# Patient Record
Sex: Male | Born: 1940 | Race: White | Hispanic: No | Marital: Married | Smoking: Never smoker
Health system: Southern US, Community
[De-identification: ages and names within clinical notes are randomized; demographics above are authoritative.]

## PROBLEM LIST (undated history)

## (undated) DIAGNOSIS — R55 Syncope and collapse: Secondary | ICD-10-CM

## (undated) DIAGNOSIS — R001 Bradycardia, unspecified: Secondary | ICD-10-CM

## (undated) DIAGNOSIS — I4891 Unspecified atrial fibrillation: Secondary | ICD-10-CM

## (undated) DIAGNOSIS — T68XXXA Hypothermia, initial encounter: Secondary | ICD-10-CM

## (undated) DIAGNOSIS — G309 Alzheimer's disease, unspecified: Secondary | ICD-10-CM

## (undated) DIAGNOSIS — F0280 Dementia in other diseases classified elsewhere without behavioral disturbance: Secondary | ICD-10-CM

## (undated) HISTORY — PX: HERNIA REPAIR: SHX51

---

## 2007-04-27 ENCOUNTER — Emergency Department (HOSPITAL_COMMUNITY): Admission: EM | Admit: 2007-04-27 | Discharge: 2007-04-27 | Payer: Self-pay | Admitting: Emergency Medicine

## 2011-06-06 LAB — HEMOGLOBIN AND HEMATOCRIT, BLOOD: Hemoglobin: 14.1

## 2016-09-17 ENCOUNTER — Inpatient Hospital Stay (HOSPITAL_COMMUNITY)
Admission: EM | Admit: 2016-09-17 | Discharge: 2016-09-18 | DRG: 260 | Disposition: A | Discharge status: Home / Self Care | Payer: Medicare Other | Attending: Internal Medicine | Admitting: Internal Medicine

## 2016-09-17 ENCOUNTER — Emergency Department (HOSPITAL_COMMUNITY): Payer: Medicare Other

## 2016-09-17 ENCOUNTER — Encounter (HOSPITAL_COMMUNITY): Payer: Self-pay | Admitting: Emergency Medicine

## 2016-09-17 DIAGNOSIS — R55 Syncope and collapse: Secondary | ICD-10-CM | POA: Diagnosis present

## 2016-09-17 DIAGNOSIS — Z79899 Other long term (current) drug therapy: Secondary | ICD-10-CM | POA: Diagnosis not present

## 2016-09-17 DIAGNOSIS — Z7982 Long term (current) use of aspirin: Secondary | ICD-10-CM | POA: Diagnosis not present

## 2016-09-17 DIAGNOSIS — R680 Hypothermia, not associated with low environmental temperature: Secondary | ICD-10-CM | POA: Diagnosis present

## 2016-09-17 DIAGNOSIS — G9340 Encephalopathy, unspecified: Secondary | ICD-10-CM | POA: Diagnosis not present

## 2016-09-17 DIAGNOSIS — F0280 Dementia in other diseases classified elsewhere without behavioral disturbance: Secondary | ICD-10-CM | POA: Diagnosis present

## 2016-09-17 DIAGNOSIS — I480 Paroxysmal atrial fibrillation: Secondary | ICD-10-CM | POA: Insufficient documentation

## 2016-09-17 DIAGNOSIS — E785 Hyperlipidemia, unspecified: Secondary | ICD-10-CM | POA: Diagnosis not present

## 2016-09-17 DIAGNOSIS — Z7901 Long term (current) use of anticoagulants: Secondary | ICD-10-CM | POA: Diagnosis not present

## 2016-09-17 DIAGNOSIS — Z8673 Personal history of transient ischemic attack (TIA), and cerebral infarction without residual deficits: Secondary | ICD-10-CM

## 2016-09-17 DIAGNOSIS — G309 Alzheimer's disease, unspecified: Secondary | ICD-10-CM | POA: Diagnosis present

## 2016-09-17 DIAGNOSIS — Z87891 Personal history of nicotine dependence: Secondary | ICD-10-CM | POA: Diagnosis not present

## 2016-09-17 DIAGNOSIS — R001 Bradycardia, unspecified: Secondary | ICD-10-CM | POA: Diagnosis present

## 2016-09-17 DIAGNOSIS — I4891 Unspecified atrial fibrillation: Secondary | ICD-10-CM | POA: Insufficient documentation

## 2016-09-17 DIAGNOSIS — F0150 Vascular dementia without behavioral disturbance: Secondary | ICD-10-CM | POA: Diagnosis present

## 2016-09-17 DIAGNOSIS — T68XXXA Hypothermia, initial encounter: Secondary | ICD-10-CM | POA: Diagnosis not present

## 2016-09-17 HISTORY — DX: Bradycardia, unspecified: R00.1

## 2016-09-17 HISTORY — DX: Unspecified atrial fibrillation: I48.91

## 2016-09-17 HISTORY — DX: Hypothermia, initial encounter: T68.XXXA

## 2016-09-17 HISTORY — DX: Alzheimer's disease, unspecified: G30.9

## 2016-09-17 HISTORY — DX: Syncope and collapse: R55

## 2016-09-17 HISTORY — DX: Dementia in other diseases classified elsewhere, unspecified severity, without behavioral disturbance, psychotic disturbance, mood disturbance, and anxiety: F02.80

## 2016-09-17 LAB — I-STAT CG4 LACTIC ACID, ED
LACTIC ACID, VENOUS: 1.87 mmol/L (ref 0.5–1.9)
LACTIC ACID, VENOUS: 1.79 mmol/L (ref 0.5–1.9)

## 2016-09-17 LAB — CBC WITH DIFFERENTIAL/PLATELET
Basophils Absolute: 0 10*3/uL (ref 0.0–0.1)
Basophils Relative: 1 %
Eosinophils Absolute: 0.2 10*3/uL (ref 0.0–0.7)
Eosinophils Relative: 4 %
HCT: 39 % (ref 39.0–52.0)
Hemoglobin: 12.4 g/dL — ABNORMAL LOW (ref 13.0–17.0)
Lymphocytes Relative: 23 %
Lymphs Abs: 1 10*3/uL (ref 0.7–4.0)
MCH: 28.6 pg (ref 26.0–34.0)
MCHC: 31.8 g/dL (ref 30.0–36.0)
MCV: 90.1 fL (ref 78.0–100.0)
Monocytes Absolute: 0.3 10*3/uL (ref 0.1–1.0)
Monocytes Relative: 8 %
Neutro Abs: 2.8 10*3/uL (ref 1.7–7.7)
Neutrophils Relative %: 64 %
Platelets: 161 10*3/uL (ref 150–400)
RBC: 4.33 MIL/uL (ref 4.22–5.81)
RDW: 14.3 % (ref 11.5–15.5)
WBC: 4.4 10*3/uL (ref 4.0–10.5)

## 2016-09-17 LAB — BASIC METABOLIC PANEL
Anion gap: 7 (ref 5–15)
BUN: 12 mg/dL (ref 6–20)
CO2: 22 mmol/L (ref 22–32)
Calcium: 8.7 mg/dL — ABNORMAL LOW (ref 8.9–10.3)
Chloride: 111 mmol/L (ref 101–111)
Creatinine, Ser: 0.79 mg/dL (ref 0.61–1.24)
GFR calc Af Amer: >60 mL/min (ref >60)
GFR calc non Af Amer: >60 mL/min (ref >60)
Glucose, Bld: 110 mg/dL — ABNORMAL HIGH (ref 65–99)
Potassium: 3.7 mmol/L (ref 3.5–5.1)
Sodium: 140 mmol/L (ref 135–145)

## 2016-09-17 LAB — TROPONIN I

## 2016-09-17 LAB — I-STAT TROPONIN, ED: TROPONIN I, POC: 0.01 ng/mL (ref 0.00–0.08)

## 2016-09-17 LAB — TSH: TSH: 2.283 u[IU]/mL (ref 0.350–4.500)

## 2016-09-17 LAB — VITAMIN B12: VITAMIN B 12: 200 pg/mL (ref 180–914)

## 2016-09-17 LAB — MRSA PCR SCREENING: MRSA by PCR: NEGATIVE

## 2016-09-17 LAB — MAGNESIUM: Magnesium: 2.1 mg/dL (ref 1.7–2.4)

## 2016-09-17 MED ORDER — PIPERACILLIN-TAZOBACTAM 3.375 G IVPB
3.3750 g | Freq: Once | INTRAVENOUS | Status: AC
  Administered 2016-09-17: 3.375 g via INTRAVENOUS
  Filled 2016-09-17: qty 50

## 2016-09-17 MED ORDER — SODIUM CHLORIDE 0.9% FLUSH
3.0000 mL | Freq: Two times a day (BID) | INTRAVENOUS | Status: DC
  Administered 2016-09-17: 3 mL via INTRAVENOUS

## 2016-09-17 MED ORDER — ATROPINE SULFATE 1 MG/10ML IJ SOSY
1.0000 mg | PREFILLED_SYRINGE | Freq: Once | INTRAMUSCULAR | Status: AC
  Administered 2016-09-17: 1 mg via INTRAVENOUS
  Filled 2016-09-17: qty 10

## 2016-09-17 MED ORDER — PRAVASTATIN SODIUM 40 MG PO TABS
80.0000 mg | ORAL_TABLET | Freq: Every day | ORAL | Status: DC
  Administered 2016-09-17 – 2016-09-18 (×2): 80 mg via ORAL
  Filled 2016-09-17 (×3): qty 2

## 2016-09-17 MED ORDER — DOCUSATE SODIUM 100 MG PO CAPS
100.0000 mg | ORAL_CAPSULE | Freq: Two times a day (BID) | ORAL | Status: DC
  Administered 2016-09-17 – 2016-09-18 (×3): 100 mg via ORAL
  Filled 2016-09-17 (×3): qty 1

## 2016-09-17 MED ORDER — ONDANSETRON HCL 4 MG/2ML IJ SOLN: 4.0000 mg | Freq: Four times a day (QID) | INTRAMUSCULAR | Status: DC | PRN

## 2016-09-17 MED ORDER — ALUM & MAG HYDROXIDE-SIMETH 200-200-20 MG/5ML PO SUSP: 30.0000 mL | Freq: Four times a day (QID) | ORAL | Status: DC | PRN

## 2016-09-17 MED ORDER — ASPIRIN EC 81 MG PO TBEC
81.0000 mg | DELAYED_RELEASE_TABLET | Freq: Every day | ORAL | Status: DC
  Administered 2016-09-17 – 2016-09-18 (×2): 81 mg via ORAL
  Filled 2016-09-17 (×2): qty 1

## 2016-09-17 MED ORDER — ACETAMINOPHEN 650 MG RE SUPP: 650.0000 mg | Freq: Four times a day (QID) | RECTAL | Status: DC | PRN

## 2016-09-17 MED ORDER — POTASSIUM CHLORIDE CRYS ER 20 MEQ PO TBCR
20.0000 meq | EXTENDED_RELEASE_TABLET | Freq: Once | ORAL | Status: AC
  Administered 2016-09-17: 20 meq via ORAL
  Filled 2016-09-17: qty 1

## 2016-09-17 MED ORDER — ENOXAPARIN SODIUM 40 MG/0.4ML ~~LOC~~ SOLN
40.0000 mg | Freq: Every day | SUBCUTANEOUS | Status: DC
  Administered 2016-09-17 – 2016-09-18 (×2): 40 mg via SUBCUTANEOUS
  Filled 2016-09-17 (×2): qty 0.4

## 2016-09-17 MED ORDER — DONEPEZIL HCL 10 MG PO TABS
20.0000 mg | ORAL_TABLET | Freq: Every day | ORAL | Status: DC
  Administered 2016-09-17: 20 mg via ORAL
  Filled 2016-09-17 (×3): qty 2

## 2016-09-17 MED ORDER — ACETAMINOPHEN 325 MG PO TABS: 650.0000 mg | ORAL_TABLET | Freq: Four times a day (QID) | ORAL | Status: DC | PRN

## 2016-09-17 MED ORDER — ONDANSETRON HCL 4 MG PO TABS: 4.0000 mg | ORAL_TABLET | Freq: Four times a day (QID) | ORAL | Status: DC | PRN

## 2016-09-17 MED ORDER — VANCOMYCIN HCL IN DEXTROSE 1-5 GM/200ML-% IV SOLN
1000.0000 mg | Freq: Once | INTRAVENOUS | Status: AC
  Administered 2016-09-17: 1000 mg via INTRAVENOUS
  Filled 2016-09-17: qty 200

## 2016-09-17 MED ORDER — SODIUM CHLORIDE 0.9 % IV BOLUS (SEPSIS)
1000.0000 mL | Freq: Once | INTRAVENOUS | Status: AC
  Administered 2016-09-17: 1000 mL via INTRAVENOUS

## 2016-09-17 MED ORDER — SODIUM CHLORIDE 0.9 % IV SOLN
INTRAVENOUS | Status: DC
  Administered 2016-09-17 – 2016-09-18 (×3): via INTRAVENOUS

## 2016-09-17 NOTE — H&P (Signed)
History and Physical    Chad Kelly RUE:[id number] DOB: [birth year] DOA: 09/17/2016  PCP: Elijio Miles, MD Patient coming from: home  Chief Complaint: syncope/bradycardia  HPI: Chad Kelly is a 76 y.o. male with medical history significant afib, dementia presents to the emergency department with chief complaint of syncope. Initial workup reveals sinus bradycardia hypothermia acute encephalopathy  Information is obtained from the patient and the wife who is at the bedside noting that information from patient may be unreliable secondary to dementia. Wife reports yesterday patient exhibiting aggressive behavior so they went to [facility]. He was prescribed benzodiapine and mirtazipine and discharge. During the night he awakened to go to the restroom. Wife reports as he was on the commode she heard him "moan" and then she heard him fall off of the commode. Reports he was not having a bowel movement. She states he was in his usual state of health and denies recent illness. No diarrhea constipation nausea vomiting. He is been eating and drinking his normal amount. She reported one year ago he was diagnosed with A. fib placed on Ranexa and Xarelto and began having "sinking spells". These medications were discontinued. She also reports being followed by cardiologist at [facility]. Patient was on a Holter monitor for 2 weeks that was unremarkable. She also remembers discussions about a pacemaker in the past as well. Patient denies headache dizziness chest pain palpitation shortness of breath abdominal pain nausea vomiting. He denies dysuria hematuria frequency or urgency. Wife also reports in past episodes patient "snapped right out of it" but today he is lethargic and slow to respond/recover to baseline.   ED Course: The emergency department he is hypothermic somewhat bradycardic. he is provided with IV fluids warming blanket and atropine. The time of admission he is hemodynamically stable  temperature of 97.5 not hypoxic  Review of Systems: As per HPI otherwise 10 point review of systems negative.   Ambulatory Status: Ambulates independently with a steady gait minimal assist with ADLs  Past Medical History:  Diagnosis Date  . A-fib (HCC)   . Alzheimer disease   . Bradycardia   . Hypothermia   . Syncope     Past Surgical History:  Procedure Laterality Date  . HERNIA REPAIR      Social History   Social History  . Marital status: Married    Spouse name: N/A  . Number of children: N/A  . Years of education: N/A   Occupational History  . Not on file.   Social History Main Topics  . Smoking status: Never Smoker  . Smokeless tobacco: Never Used  . Alcohol use No  . Drug use: No  . Sexual activity: Not on file   Other Topics Concern  . Not on file   Social History Narrative  . No narrative on file  He is married he lives at home with his wife and has done so for the last 50 years. He is a former smoker having quit 40 years ago. He is a retired Psychologist, occupational  No Known Allergies  Family History  Problem Relation Age of Onset  . Stomach cancer Mother   . CAD Father   . CAD Sister   . CAD Brother     1 died at age 35, 2nd brother died age 34    Prior to Admission medications   Medication Sig Start Date End Date Taking? Authorizing Provider  aspirin EC 81 MG tablet Take 81 mg by mouth daily.   Yes  Historical Provider, MD  donepezil (ARICEPT) 10 MG tablet Take 20 mg by mouth at bedtime.   Yes Historical Provider, MD  memantine (NAMENDA) 10 MG tablet Take 10 mg by mouth 2 (two) times daily.   Yes Historical Provider, MD  mirtazapine (REMERON) 15 MG tablet Take 15 mg by mouth at bedtime.   Yes Historical Provider, MD  pravastatin (PRAVACHOL) 80 MG tablet Take 80 mg by mouth daily.   Yes Historical Provider, MD    Physical Exam: Vitals:   09/17/16 0600 09/17/16 0630 09/17/16 0700 09/17/16 0728  BP: 126/72 120/64 130/71   Pulse: 70 62 62   Resp: 13 11 (!) 7    Temp:    97.5 F (36.4 C)  TempSrc:    Oral  SpO2: 100% 100% 99%   Weight:      Height:         General:  Appears calm and comfortable No acute distress Eyes:  PERRL, EOMI, normal lids, iris ENT:  grossly normal hearing, lips & tongue, his membranes of his mouth are pink but dry Neck:  no LAD, masses or thyromegaly Cardiovascular:  RRR, no m/r/g. No LE edema. Pulses present and palpable Respiratory:  CTA bilaterally, no w/r/r. Normal respiratory effort. Abdomen:  soft, ntnd, positive bowel sounds no guarding or rebounding Skin:  no rash or induration seen on limited exam Musculoskeletal:  grossly normal tone BUE/BLE, good ROM, no bony abnormality Psychiatric:  grossly normal mood and affect, speech fluent and appropriate, AOx3 Neurologic:  He is oriented to self and place moves all extremities follows commands speech clear facial symmetry  Labs on Admission: I have personally reviewed following labs and imaging studies  CBC:  Recent Labs Lab 09/17/16 0523  WBC 4.4  NEUTROABS 2.8  HGB 12.4*  HCT 39.0  MCV 90.1  PLT 161   Basic Metabolic Panel:  Recent Labs Lab 09/17/16 0523  NA 140  K 3.7  CL 111  CO2 22  GLUCOSE 110*  BUN 12  CREATININE 0.79  CALCIUM 8.7*  MG 2.1   GFR: Estimated Creatinine Clearance: 86.9 mL/min (by C-G formula based on SCr of 0.79 mg/dL). Liver Function Tests: No results for input(s): AST, ALT, ALKPHOS, BILITOT, PROT, ALBUMIN in the last 168 hours. No results for input(s): LIPASE, AMYLASE in the last 168 hours. No results for input(s): AMMONIA in the last 168 hours. Coagulation Profile: No results for input(s): INR, PROTIME in the last 168 hours. Cardiac Enzymes: No results for input(s): CKTOTAL, CKMB, CKMBINDEX, TROPONINI in the last 168 hours. BNP (last 3 results) No results for input(s): PROBNP in the last 8760 hours. HbA1C: No results for input(s): HGBA1C in the last 72 hours. CBG: No results for input(s): GLUCAP in the last  168 hours. Lipid Profile: No results for input(s): CHOL, HDL, LDLCALC, TRIG, CHOLHDL, LDLDIRECT in the last 72 hours. Thyroid Function Tests:  Recent Labs  09/17/16 0643  TSH 2.283   Anemia Panel: No results for input(s): VITAMINB12, FOLATE, FERRITIN, TIBC, IRON, RETICCTPCT in the last 72 hours. Urine analysis: No results found for: COLORURINE, APPEARANCEUR, LABSPEC, PHURINE, GLUCOSEU, HGBUR, BILIRUBINUR, KETONESUR, PROTEINUR, UROBILINOGEN, NITRITE, LEUKOCYTESUR  Creatinine Clearance: Estimated Creatinine Clearance: 86.9 mL/min (by C-G formula based on SCr of 0.79 mg/dL).  Sepsis Labs: @LABRCNTIP (procalcitonin:4,lacticidven:4) )No results found for this or any previous visit (from the past 240 hour(s)).   Radiological Exams on Admission: Ct Head Wo Contrast  Result Date: 09/17/2016 CLINICAL DATA:  Initial evaluation for acute syncope.  Fall. EXAM:  CT HEAD WITHOUT CONTRAST TECHNIQUE: Contiguous axial images were obtained from the base of the skull through the vertex without intravenous contrast. COMPARISON:  Prior CT from 08/13/2015. FINDINGS: Brain: Generalized cerebral atrophy with moderate chronic microvascular ischemic disease, stable. No acute intracranial hemorrhage. No evidence for acute large vessel territory infarct. No mass lesion, midline shift or mass effect. No hydrocephalus. No extra-axial fluid collection. Vascular: No hyperdense vessel. Vascular calcifications noted within the carotid siphons. Skull: Scalp soft tissues within normal limits the. Calvarium intact. Sinuses/Orbits: Globes and orbits within normal limits. Paranasal sinuses and mastoids are clear. IMPRESSION: 1. No acute intracranial process identified. 2. Moderate atrophy with chronic small vessel ischemic disease. Electronically Signed   By: Rise Mu M.D.   On: 09/17/2016 06:51   Dg Chest Port 1 View  Result Date: 09/17/2016 CLINICAL DATA:  76 year old male with syncope, bradycardia, and fall.  EXAM: PORTABLE CHEST 1 VIEW COMPARISON:  Chest radiograph dated 08/13/2015 FINDINGS: Single portable view of the chest demonstrates clear lungs. There is no pleural effusion or pneumothorax. The cardiac silhouette is within normal limits. No acute osseous pathology identified. IMPRESSION: No active disease. Electronically Signed   By: Elgie Collard M.D.   On: 09/17/2016 06:25    EKG: Independently reviewed. Sinus rhythm Abnormal R-wave progression, early transition Borderline ST elevation, lateral leads Borderline prolonged QT interval  Assessment/Plan Active Problems:   Sinus bradycardia   Acute encephalopathy   Hypothermia   Syncope and collapse   #1. Sinus bradycardia. Etiology unclear. History of A. fib not on rate controlled medication he has a history of same. May be related to the hypothermia upon presentation. EKG as noted above. Initial troponin negative. No signs of infection. No metabolic derangement but potassium is low end of normal. CT of the head without acute abnormality. He is provided with atropine and kdur. At time of admission heart rate 64. -admit to stepdown -continue IV fluids -monitor electrolytes -cycle troponin -obtain echo -if remains stable, discharge 24 hours and follow up with cardiologist in [facility]  2. Syncope and collapse. Etiology unclear but suspect underlying cardiac issue in setting of newly prescribed mirtazapine. Patient diagnosed with A. fib last year was placed on Ranexa and Xarelto. Wife reports several episodes while on these medications. These were discontinued several months ago. He reports 2 week Holter monitor test several months ago that was unremarkable. CT of the head without any acute abnormality. No signs of infectious process. No metabolic derangement. -Continue IV fluids -monitor on telemetry -echo for completeness -cycle troponin  3. Hypothermia. Resolved on admission. Provided with IV fluids and broad-spectrum antibiotics and  bare hugger while in the emergency department. -see above -monitor -will discontinue antibiotics  4 acute encephalopathy in setting early dementia. CT head without acute abnormality. No infection. No metabolic derangement. May be related to above in setting of recent administration of benzo and mirtazapine. -hold mirtazapine -monitor -OP follow up with PCP   DVT prophylaxis: lovenox  Code Status: full Family Communication: wife at bedside  Disposition Plan: home  Consults called: none  Admission status: inpatient    Gwenyth Bender MD [facility]  If 7PM-7AM, please contact night-coverage [email or web address] Password [facility]  09/17/2016, 7:54 AM

## 2016-09-17 NOTE — ED Notes (Signed)
Attempted to in-and-out cath pt X2, no success. EDP aware. Stated to place condom cath on.

## 2016-09-17 NOTE — ED Notes (Signed)
Admitting at bedside 

## 2016-09-17 NOTE — ED Triage Notes (Signed)
Pt brought to ED by [facility] from home for a syncope episode while going to bathroom, pt found by wife sitting on the floor very diaphoretic with HR on the 38, wife states pt was seen yesterday on HP last seen normal last night before going to bed at 2230, SB with 1 avb on EMS monitor, wife denies nausea, vomiting or diarrhea no fever, pt progressively becoming more confuse and aggressive , early state of Alzheimer dz. Pt AO to self. VS for EMS BP 136/70, HR 38, R-18, SPO2 100 RA, CBG 104, 500 mL NS bolus given by EMS.

## 2016-09-17 NOTE — ED Notes (Addendum)
Bair hugger placed on pt.

## 2016-09-17 NOTE — ED Notes (Signed)
Pt. Requesting to be taken off warming device. D/C at this time. Pt. Oral temp 97.5. Also, pt. Placed in condom cath for urinalysis. No urine output yet. Will collect when able to.
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
## 2016-09-17 NOTE — ED Notes (Signed)
Pt. Placed on hospital bed for comfort.

## 2016-09-17 NOTE — ED Notes (Signed)
Repeat EKG obtained and given to EDP

## 2016-09-17 NOTE — ED Notes (Signed)
Pt transported to CT ?

## 2016-09-18 ENCOUNTER — Inpatient Hospital Stay (HOSPITAL_COMMUNITY): Payer: Medicare Other

## 2016-09-18 ENCOUNTER — Encounter (HOSPITAL_COMMUNITY)
Admission: EM | Disposition: A | Discharge status: Home / Self Care | Payer: Self-pay | Source: Home / Self Care | Attending: Internal Medicine

## 2016-09-18 DIAGNOSIS — R001 Bradycardia, unspecified: Secondary | ICD-10-CM

## 2016-09-18 DIAGNOSIS — T68XXXS Hypothermia, sequela: Secondary | ICD-10-CM

## 2016-09-18 DIAGNOSIS — R55 Syncope and collapse: Secondary | ICD-10-CM

## 2016-09-18 DIAGNOSIS — G9340 Encephalopathy, unspecified: Secondary | ICD-10-CM | POA: Diagnosis not present

## 2016-09-18 DIAGNOSIS — I480 Paroxysmal atrial fibrillation: Secondary | ICD-10-CM | POA: Diagnosis not present

## 2016-09-18 HISTORY — PX: EP IMPLANTABLE DEVICE: SHX172B

## 2016-09-18 LAB — ECHOCARDIOGRAM COMPLETE
AVLVOTPG: 5 mmHg
AVPHT: 406 ms
CHL CUP MV DEC (S): 225
E decel time: 225 msec
EERAT: 5.69
FS: 25 % — AB (ref 28–44)
Height: 72 in
IV/PV OW: 0.93
LA diam end sys: 38 mm
LA diam index: 2.2 cm/m2
LASIZE: 38 mm
LAVOL: 58.1 mL
LAVOLA4C: 53.9 mL
LAVOLIN: 33.6 mL/m2
LDCA: 4.52 cm2
LV E/e' medial: 5.69
LV E/e'average: 5.69
LV TDI E'LATERAL: 12.2
LV e' LATERAL: 12.2 cm/s
LVOT SV: 115 mL
LVOT VTI: 25.5 cm
LVOT peak vel: 117 cm/s
LVOTD: 24 mm
Lateral S' vel: 14.6 cm/s
MV pk E vel: 69.4 m/s
MVPKAVEL: 85.9 m/s
PW: 9.63 mm — AB (ref 0.6–1.1)
TAPSE: 28.2 mm
TDI e' medial: 8.84
Weight: 1947.2 oz

## 2016-09-18 LAB — BASIC METABOLIC PANEL
Anion gap: 6 (ref 5–15)
BUN: 7 mg/dL (ref 6–20)
CALCIUM: 8.5 mg/dL — AB (ref 8.9–10.3)
CO2: 25 mmol/L (ref 22–32)
CREATININE: 0.71 mg/dL (ref 0.61–1.24)
Chloride: 112 mmol/L — ABNORMAL HIGH (ref 101–111)
Glucose, Bld: 85 mg/dL (ref 65–99)
Potassium: 3.8 mmol/L (ref 3.5–5.1)
Sodium: 143 mmol/L (ref 135–145)

## 2016-09-18 LAB — URINALYSIS, ROUTINE W REFLEX MICROSCOPIC
BACTERIA UA: NONE SEEN
Bilirubin Urine: NEGATIVE
Glucose, UA: NEGATIVE mg/dL
Hgb urine dipstick: NEGATIVE
Ketones, ur: NEGATIVE mg/dL
NITRITE: NEGATIVE
PH: 6 (ref 5.0–8.0)
Protein, ur: NEGATIVE mg/dL
SPECIFIC GRAVITY, URINE: 1.008 (ref 1.005–1.030)
Squamous Epithelial / LPF: NONE SEEN

## 2016-09-18 LAB — CORTISOL: Cortisol, Plasma: 9 ug/dL

## 2016-09-18 LAB — GLUCOSE, CAPILLARY: GLUCOSE-CAPILLARY: 79 mg/dL (ref 65–99)

## 2016-09-18 LAB — CBC
HCT: 36.4 % — ABNORMAL LOW (ref 39.0–52.0)
Hemoglobin: 11.7 g/dL — ABNORMAL LOW (ref 13.0–17.0)
MCH: 28.8 pg (ref 26.0–34.0)
MCHC: 32.1 g/dL (ref 30.0–36.0)
MCV: 89.7 fL (ref 78.0–100.0)
PLATELETS: 166 10*3/uL (ref 150–400)
RBC: 4.06 MIL/uL — AB (ref 4.22–5.81)
RDW: 14.3 % (ref 11.5–15.5)
WBC: 5.6 10*3/uL (ref 4.0–10.5)

## 2016-09-18 LAB — FOLATE RBC
FOLATE, HEMOLYSATE: 429.4 ng/mL
FOLATE, RBC: 1154 ng/mL (ref >498)
HEMATOCRIT: 37.2 % — AB (ref 37.5–51.0)

## 2016-09-18 SURGERY — LOOP RECORDER INSERTION

## 2016-09-18 MED ORDER — LIDOCAINE HCL (PF) 1 % IJ SOLN
INTRAMUSCULAR | Status: DC | PRN
  Administered 2016-09-18: 30 mL

## 2016-09-18 MED ORDER — LIDOCAINE HCL (PF) 1 % IJ SOLN
INTRAMUSCULAR | Status: AC
  Filled 2016-09-18: qty 30

## 2016-09-18 SURGICAL SUPPLY — 2 items
LOOP REVEAL LINQSYS (Prosthesis & Implant Heart) ×3 IMPLANT
PACK LOOP INSERTION (CUSTOM PROCEDURE TRAY) ×3 IMPLANT

## 2016-09-18 NOTE — Consult Note (Signed)
ELECTROPHYSIOLOGY CONSULT NOTE    Patient ID: Chad Kelly MRN: [id number], DOB/AGE: [birth year] 76 y.o.  Admit date: 09/17/2016 Date of Consult: 09/18/2016   Primary Physician: Elijio Miles, MD Primary Cardiologist:  Requesting MD: Dr. Elvera Lennox  Reason for Consultation: syncope, bradycardia  HPI: Chad Kelly is a 76 y.o. male with pMHx of Vascular dimentia, "mini stroke" in 2013 and AFib brought to [facility] ER after a syncopal event.  The patient had gotten up in the early morning hours and while on the toilet he fainted, reportedly while urinating.  The patient had dementia and is an unreliable historian and defers to his wife for details. His wife is at bedside and provide PMHx (by written notes) and HPI.  The patient back in Dec of 2016 fainted, FR was called and at this visit found to have AFib, started on Ranexa and Xarelto.  He had perhaps 4-5 fainting spells  Between then and June 2017.  Some seemed to have some warning, others did not, at least one other occurred while seated at a table.  There have been no reports of any palpitations or CP, no SOB with or without his syncope.  His cardiologist had him wear a 2 week monitor as far as the wife recalls without and remarkable findings.  There was concern by her after reading the rx inserts that the ranexa particularly could be contributing to the fainting and in discussion with the cardiologist the ranexa and xarelto were stopped.  There was no reports of bleeding issues.  The patient's wife mentions there was some discussion he may need a pacer though not at that time (in June with the last fainting spell until now).  With the spells he is observed to be cool, diaphoretic, wakes clear, though this time seemed to take a bit longer.  This event, she mentions that on Tuesday he had become verbally and physcially aggressive (felt to be a progression of his dementia, given baseline personality very passive) eventually needing to call 911, the police and  FR took him to [facility] where he was rx Remeron and discharged from the ER.  He had one dose, and yesterday evening is when he fainted and was brought to [facility]. SHe heard him moan in the BR and then heard him fall.  She found him profusely diaphoretic, was awake by the time FR took him.  This admission  ER presenting vitals:  Temp 95.7 >> (tx with warming blanket, IVF in ED) >> 97.5 151/64 40 bpm 100% sat on RA  LABS: BUN/Creat 12/0.79 K+ 3.7 H/H 12/39 WBC 4.4 Plts 161 poc Trop 0.01 Trop I: <0.03 x2 TSH 2.283 Glucose 110  Past Medical History:  Diagnosis Date  . A-fib (HCC)   . Alzheimer disease   . Bradycardia   . Hypothermia   . Syncope      Surgical History:  Past Surgical History:  Procedure Laterality Date  . HERNIA REPAIR       Prescriptions Prior to Admission  Medication Sig Dispense Refill Last Dose  . aspirin EC 81 MG tablet Take 81 mg by mouth daily.   09/16/2016 at Unknown time  . donepezil (ARICEPT) 10 MG tablet Take 20 mg by mouth at bedtime.   09/16/2016 at Unknown time  . memantine (NAMENDA) 10 MG tablet Take 10 mg by mouth 2 (two) times daily.   09/16/2016 at Unknown time  . mirtazapine (REMERON) 15 MG tablet Take 15 mg by mouth at bedtime.   09/16/2016 at Unknown  time  . pravastatin (PRAVACHOL) 80 MG tablet Take 80 mg by mouth daily.   09/16/2016 at Unknown time    Inpatient Medications:  . aspirin EC  81 mg Oral Daily  . docusate sodium  100 mg Oral BID  . donepezil  20 mg Oral QHS  . enoxaparin (LOVENOX) injection  40 mg Subcutaneous Daily  . pravastatin  80 mg Oral Daily  . sodium chloride flush  3 mL Intravenous Q12H    Allergies: No Known Allergies  Social History   Social History  . Marital status: Married    Spouse name: N/A  . Number of children: N/A  . Years of education: N/A   Occupational History  . Not on file.   Social History Main Topics  . Smoking status: Never Smoker  . Smokeless tobacco: Never Used  . Alcohol use No  . Drug  use: No  . Sexual activity: Not on file   Other Topics Concern  . Not on file   Social History Narrative  . No narrative on file     Family History  Problem Relation Age of Onset  . Stomach cancer Mother   . CAD Father   . CAD Sister   . CAD Brother     1 died at age 74, 2nd brother died age 74     Review of Systems: Patient denies any active symptoms, dementia limits ability to obtain ROS  Physical Exam: Vitals:   09/18/16 1100 09/18/16 1226 09/18/16 1229 09/18/16 1230  BP: 136/67 131/68 (!) 141/63 138/73  Pulse: (!) 56 64 (!) 57 60  Resp: 17 15 16 12   Temp: 97.4 F (36.3 C)     TempSrc: Oral     SpO2: 100% 100% 99% 97%  Weight:      Height:        GEN- The patient is well appearing, alert and oriented to place, self, knows his wife. HEENT: normocephalic, atraumatic; sclera clear, conjunctiva pink; hearing intact; oropharynx clear; neck supple, no JVP Lymph- no cervical lymphadenopathy Lungs- Clear to ausculation bilaterally, normal work of breathing.  No wheezes, rales, rhonchi Heart- Regular rate and rhythm, no murmurs, rubs or gallops GI- soft, non-tender, non-distended Extremities- no clubbing, cyanosis, or edema MS- no significant deformity or atrophy Skin- warm and dry, no rash or lesion Psych- euthymic mood, full affect Neuro- no gross deficits observed  Labs:   Lab Results  Component Value Date   WBC 5.6 09/18/2016   HGB 11.7 (L) 09/18/2016   HCT 36.4 (L) 09/18/2016   MCV 89.7 09/18/2016   PLT 166 09/18/2016    Recent Labs Lab 09/18/16 0205  NA 143  K 3.8  CL 112*  CO2 25  BUN 7  CREATININE 0.71  CALCIUM 8.5*  GLUCOSE 85      Radiology/Studies:  Ct Head Wo Contrast Result Date: 09/17/2016 CLINICAL DATA:  Initial evaluation for acute syncope.  Fall. EXAM: CT HEAD WITHOUT CONTRAST TECHNIQUE: Contiguous axial images were obtained from the base of the skull through the vertex without intravenous contrast. COMPARISON:  Prior CT from  08/13/2015. FINDINGS: Brain: Generalized cerebral atrophy with moderate chronic microvascular ischemic disease, stable. No acute intracranial hemorrhage. No evidence for acute large vessel territory infarct. No mass lesion, midline shift or mass effect. No hydrocephalus. No extra-axial fluid collection. Vascular: No hyperdense vessel. Vascular calcifications noted within the carotid siphons. Skull: Scalp soft tissues within normal limits the. Calvarium intact. Sinuses/Orbits: Globes and orbits within normal limits. Paranasal sinuses  and mastoids are clear. IMPRESSION: 1. No acute intracranial process identified. 2. Moderate atrophy with chronic small vessel ischemic disease. Electronically Signed   By: Rise Mu M.D.   On: 09/17/2016 06:51   Dg Chest Port 1 View Result Date: 09/17/2016 CLINICAL DATA:  76 year old male with syncope, bradycardia, and fall. EXAM: PORTABLE CHEST 1 VIEW COMPARISON:  Chest radiograph dated 08/13/2015 FINDINGS: Single portable view of the chest demonstrates clear lungs. There is no pleural effusion or pneumothorax. The cardiac silhouette is within normal limits. No acute osseous pathology identified. IMPRESSION: No active disease. Electronically Signed   By: Elgie Collard M.D.   On: 09/17/2016 06:25    EKG: Presenting is SB, 36bpm,  PR , QRS 82ms, QTc #2 is SR 76bpm TELEMETRY: SB/junctional rhythm 30's >> 50's-70's since ER (atropine)  Today: TTE Study Conclusions - Left ventricle: The cavity size was normal. Systolic function was   normal. The estimated ejection fraction was in the range of 60%   to 65%. Wall motion was normal; there were no regional wall   motion abnormalities. - Aortic valve: There was mild regurgitation. - Mitral valve: There was mild regurgitation.   Assessment and Plan:   1. Syncope     Uncertain mechanism 2. Bradycardia     While in the ER he was observed with rates 38-40bpm, stable BP and wife reports he remained  awake and responsive since he was transported to the ER, was asymptomatic.      Aricept can contribute to bradycardia, will defer to his PMD/cardiologist given his dementia appears to be advancing  Plan for loop monitor implant and out patient follow up with his primary cardiologist with in the next 3-4 weeks, they will have his ILR managed there.  3. Hx of AFib     CHA2DS2Vasc is 4, 1 for age, and 3 w/hx of TIA     Initially on xarelto but discontinued     We will defer anticoagulation decisions to his primary cardiologist who knows him well.  4. Vascular dementia        Signed, Francis Dowse, PA-C 09/18/2016 1:01 PM  EP Attending  Patient seen and examined. Agree with the findings as above. He has bradycardia but it is unclear whether his syncope was caused by bradycardia or something else. I have recommended he undergo insertion of an ILR. The indications for the procedure and the risks/benefits/ expectations were revedwed and he wishes to proceed. He can be discharged home after.  Leonia Reeves.D.

## 2016-09-18 NOTE — Progress Notes (Signed)
  Echocardiogram 2D Echocardiogram has been performed.  Chad Kelly, Chad Kelly 09/18/2016, 8:44 AM

## 2016-09-18 NOTE — Care Management Note (Signed)
Case Management Note  Patient Details  Name: Chad Kelly MRN: [id number] Date of Birth: 17-Feb-1941  Subjective/Objective:    Presents with syncope, bradycardia, hypothermia, acute encephalopathy, NCM will cont to folllow for dc needs.                 Action/Plan:   Expected Discharge Date:                  Expected Discharge Plan:     In-House Referral:     Discharge planning Services  CM Consult  Post Acute Care Choice:    Choice offered to:     DME Arranged:    DME Agency:     HH Arranged:    HH Agency:     Status of Service:  In process, will continue to follow  If discussed at Long Length of Stay Meetings, dates discussed:    Additional Comments:  Leone Havenaylor, Darria Corvera Clinton, RN 09/18/2016, 11:51 AM

## 2016-09-18 NOTE — Progress Notes (Deleted)
PROGRESS NOTE  Chad Kelly ZOX:[id number] DOB: 17-Apr-1941 DOA: 09/17/2016 PCP: Elijio Miles, MD   LOS: 1 day   Brief Narrative: Chad Kelly is a 76 y.o. male with medical history significant afib, dementia presents to the emergency department with chief complaint of syncope. Initial workup reveals sinus bradycardia hypothermia acute encephalopathy  Assessment & Plan: Active Problems:   Sinus bradycardia   Acute encephalopathy   Hypothermia   Syncope and collapse  Syncope / sinus bradycardia / hypothermia - Unclear etiology, patient has had a history of syncopal episodes in the past per wife who is at bedside. He was extensively worked up by his cardiologist in [place] without a clear etiology. He apparently wore a Holter monitor without any significant arrhythmias or bradycardias. EKG in the emergency room showed heart rate in the upper 30s and received atropine with improvement in his heart rate. He is not on any AV nodal agents at home. I consulted cardiology, appreciate input. Unlikely related to mirtazapine.  - No infection evident, no need for antibiotics - TSH normal - Cortisone was checked this morning is within normal limits for a.m. Levels - 2-D echo done today showed normal ejection fraction 60-65%  PAF - Not on anticoagulation per his cardiologist due to syncopal episodes in the past and falls, ChadsVasc score greater than 2   Dementia - Continue Aricept  Hyperlipidemia - Continue pravastatin   DVT prophylaxis: Lovenox Code Status: Full code Family Communication: wife bedside Disposition Plan: TBD  Consultants:   Cardiology   Procedures:   2D echo  Antimicrobials:  None    Subjective: - no chest pain, shortness of breath, no abdominal pain, nausea or vomiting. Underlying dementia  Objective: Vitals:   09/18/16 0400 09/18/16 0700 09/18/16 0800 09/18/16 1100  BP: 132/86 (!) 139/109 132/66 136/67  Pulse: 69 (!) 58 (!) 56 (!) 56  Resp:  16  17    Temp:  97.6 F (36.4 C)  97.4 F (36.3 C)  TempSrc:  Oral  Oral  SpO2: 99% 100% 98% 100%  Weight:      Height:        Intake/Output Summary (Last 24 hours) at 09/18/16 1135 Last data filed at 09/18/16 0600  Gross per 24 hour  Intake          3991.67 ml  Output             2675 ml  Net          1316.67 ml   Filed Weights   09/17/16 0506 09/17/16 1835 09/18/16 0340  Weight: 75.8 kg (167 lb) 72.5 kg (159 lb 13.3 oz) 55.2 kg (121 lb 11.2 oz)    Examination: Constitutional: NAD Vitals:   09/18/16 0400 09/18/16 0700 09/18/16 0800 09/18/16 1100  BP: 132/86 (!) 139/109 132/66 136/67  Pulse: 69 (!) 58 (!) 56 (!) 56  Resp:  16  17  Temp:  97.6 F (36.4 C)  97.4 F (36.3 C)  TempSrc:  Oral  Oral  SpO2: 99% 100% 98% 100%  Weight:      Height:       Eyes: PERRL, lids and conjunctivae normal Respiratory: clear to auscultation bilaterally, no wheezing, no crackles.  Cardiovascular: Regular rate and rhythm, no murmurs / rubs / gallops. No LE edema.  Abdomen: no tenderness. Bowel sounds positive.  Neurologic: CN 2-12 grossly intact. Strength 5/5 in all 4.  Psychiatric: Normal mood.    Data Reviewed: I have personally reviewed following labs and imaging studies  CBC:  Recent Labs Lab 09/17/16 0523 09/18/16 0205  WBC 4.4 5.6  NEUTROABS 2.8  --   HGB 12.4* 11.7*  HCT 39.0 36.4*  MCV 90.1 89.7  PLT 161 166   Basic Metabolic Panel:  Recent Labs Lab 09/17/16 0523 09/18/16 0205  NA 140 143  K 3.7 3.8  CL 111 112*  CO2 22 25  GLUCOSE 110* 85  BUN 12 7  CREATININE 0.79 0.71  CALCIUM 8.7* 8.5*  MG 2.1  --    GFR: Estimated Creatinine Clearance: 63.3 mL/min (by C-G formula based on SCr of 0.71 mg/dL). Liver Function Tests: No results for input(s): AST, ALT, ALKPHOS, BILITOT, PROT, ALBUMIN in the last 168 hours. No results for input(s): LIPASE, AMYLASE in the last 168 hours. No results for input(s): AMMONIA in the last 168 hours. Coagulation Profile: No results  for input(s): INR, PROTIME in the last 168 hours. Cardiac Enzymes:  Recent Labs Lab 09/17/16 0945 09/17/16 1310  TROPONINI <0.03 <0.03   BNP (last 3 results) No results for input(s): PROBNP in the last 8760 hours. HbA1C: No results for input(s): HGBA1C in the last 72 hours. CBG:  Recent Labs Lab 09/18/16 0630  GLUCAP 79   Lipid Profile: No results for input(s): CHOL, HDL, LDLCALC, TRIG, CHOLHDL, LDLDIRECT in the last 72 hours. Thyroid Function Tests:  Recent Labs  09/17/16 0643  TSH 2.283   Anemia Panel:  Recent Labs  09/17/16 0945  VITAMINB12 200   Urine analysis: No results found for: COLORURINE, APPEARANCEUR, LABSPEC, PHURINE, GLUCOSEU, HGBUR, BILIRUBINUR, KETONESUR, PROTEINUR, UROBILINOGEN, NITRITE, LEUKOCYTESUR Sepsis Labs: Invalid input(s): PROCALCITONIN, LACTICIDVEN  Recent Results (from the past 240 hour(s))  MRSA PCR Screening     Status: None   Collection Time: 09/17/16  6:21 PM  Result Value Ref Range Status   MRSA by PCR NEGATIVE NEGATIVE Final    Comment:        The GeneXpert MRSA Assay (FDA approved for NASAL specimens only), is one component of a comprehensive MRSA colonization surveillance program. It is not intended to diagnose MRSA infection nor to guide or monitor treatment for MRSA infections.       Radiology Studies: Ct Head Wo Contrast  Result Date: 09/17/2016 CLINICAL DATA:  Initial evaluation for acute syncope.  Fall. EXAM: CT HEAD WITHOUT CONTRAST TECHNIQUE: Contiguous axial images were obtained from the base of the skull through the vertex without intravenous contrast. COMPARISON:  Prior CT from 08/13/2015. FINDINGS: Brain: Generalized cerebral atrophy with moderate chronic microvascular ischemic disease, stable. No acute intracranial hemorrhage. No evidence for acute large vessel territory infarct. No mass lesion, midline shift or mass effect. No hydrocephalus. No extra-axial fluid collection. Vascular: No hyperdense vessel.  Vascular calcifications noted within the carotid siphons. Skull: Scalp soft tissues within normal limits the. Calvarium intact. Sinuses/Orbits: Globes and orbits within normal limits. Paranasal sinuses and mastoids are clear. IMPRESSION: 1. No acute intracranial process identified. 2. Moderate atrophy with chronic small vessel ischemic disease. Electronically Signed   By: Rise Mu M.D.   On: 09/17/2016 06:51   Dg Chest Port 1 View  Result Date: 09/17/2016 CLINICAL DATA:  76 year old male with syncope, bradycardia, and fall. EXAM: PORTABLE CHEST 1 VIEW COMPARISON:  Chest radiograph dated 08/13/2015 FINDINGS: Single portable view of the chest demonstrates clear lungs. There is no pleural effusion or pneumothorax. The cardiac silhouette is within normal limits. No acute osseous pathology identified. IMPRESSION: No active disease. Electronically Signed   By: Elgie Collard M.D.   On:  09/17/2016 06:25     Scheduled Meds: . aspirin EC  81 mg Oral Daily  . docusate sodium  100 mg Oral BID  . donepezil  20 mg Oral QHS  . enoxaparin (LOVENOX) injection  40 mg Subcutaneous Daily  . pravastatin  80 mg Oral Daily  . sodium chloride flush  3 mL Intravenous Q12H   Continuous Infusions: . sodium chloride 125 mL/hr at 09/18/16 [id number]   Pamella Pertostin Mihail Prettyman, MD, PhD [facility] Pager [phone]  If 7PM-7AM, please contact night-coverage [email or web address] Password [email or web address] 09/18/2016, 11:35 AM

## 2016-09-18 NOTE — Progress Notes (Signed)
Loop recorder implanted, small amount of oozing on dressing, marked at border. No further bleeding at the site.

## 2016-09-18 NOTE — Discharge Summary (Signed)
Physician Discharge Summary  Chad Kelly XWR:[id number] DOB: April 10, 1941 DOA: 09/17/2016  PCP: Chad Miles, MD  Admit date: 09/17/2016 Discharge date: 09/18/2016  Admitted From: home Disposition:  home  Recommendations for Outpatient Follow-up:  1. Follow up with PCP in 1-2 weeks 2. Follow up with primary cardiologist in [facility] in 2 weeks  Home Health: none Equipment/Devices: none  Discharge Condition: stable CODE STATUS: Full Diet recommendation: regular  HPI: Per Chad Smothers, NP Chad Kelly is a 76 y.o. male with medical history significant afib, dementia presents to the emergency department with chief complaint of syncope. Initial workup reveals sinus bradycardia hypothermia acute encephalopathy. Information is obtained from the patient and the wife who is at the bedside noting that information from patient may be unreliable secondary to dementia. Wife reports yesterday patient exhibiting aggressive behavior so they went to [facility]. He was prescribed benzodiapine and mirtazipine and discharge. During the night he awakened to go to the restroom. Wife reports as he was on the commode she heard him "moan" and then she heard him fall off of the commode. Reports he was not having a bowel movement. She states he was in his usual state of health and denies recent illness. No diarrhea constipation nausea vomiting. He is been eating and drinking his normal amount. She reported one year ago he was diagnosed with A. fib placed on Ranexa and Xarelto and began having "sinking spells". These medications were discontinued. She also reports being followed by cardiologist at [facility] Patient was on a Holter monitor for 2 weeks that was unremarkable. She also remembers discussions about a pacemaker in the past as well. Patient denies headache dizziness chest pain palpitation shortness of breath abdominal pain nausea vomiting. He denies dysuria hematuria frequency or urgency. Wife  also reports in past episodes patient "snapped right out of it" but today he is lethargic and slow to respond/recover to baseline.   Hospital Course: Discharge Diagnoses:  Active Problems:   Sinus bradycardia   Acute encephalopathy   Hypothermia   Syncope and collapse  Syncope / sinus bradycardia / hypothermia - Unclear etiology, patient has had a history of syncopal episodes in the past per wife who is at bedside. He was extensively worked up by his cardiologist in [place] without a clear etiology. He apparently wore a Holter monitor without any significant arrhythmias or bradycardias. EKG in the emergency room showed heart rate in the upper 30s and received atropine with improvement in his heart rate. He is not on any AV nodal agents at home. Cardiology was consulted and have evaluated patient was hospitalized. He was seen by electrophysiology. Patient underwent a implantable loop recorder, without palpitations. His heart rate improved, his mental status is back to baseline, had a TSH was checked which was normal. His cortisol level was normal. He underwent a 2-D echo which showed normal ejection fraction of 60-65%. He'll be discharged home in stable condition, improved, and will need to have close follow-up with primary cardiologist in [facility] for the loop recorder interrogation. PAF - Not on anticoagulation per his cardiologist due to syncopal episodes in the past and falls, ChadsVasc score greater than 2  Dementia - Continue Aricept Hyperlipidemia - Continue pravastatin   Discharge Instructions   Allergies as of 09/18/2016   No Known Allergies     Medication List    TAKE these medications   aspirin EC 81 MG tablet Take 81 mg by mouth daily.   donepezil 10 MG  tablet Commonly known as:  ARICEPT Take 20 mg by mouth at bedtime.   memantine 10 MG tablet Commonly known as:  NAMENDA Take 10 mg by mouth 2 (two) times daily.   mirtazapine 15 MG tablet Commonly known as:   REMERON Take 15 mg by mouth at bedtime.   pravastatin 80 MG tablet Commonly known as:  PRAVACHOL Take 80 mg by mouth daily.      Follow-up Information    [facility] Follow up on 10/01/2016.   Specialty:  Cardiology Why:  9:00AM, wound check Contact information: [address] [place] [address] [phone]       Chad Milliner, MD. Schedule an appointment as soon as possible for a visit in 3 week(s).   Specialty:  Cardiology Contact information: [address] [place] [address] [phone]          No Known Allergies  Consultations: Cardiology  Procedures/Studies:  2D echo  Study Conclusions - Left ventricle: The cavity size was normal. Systolic function was normal. The estimated ejection fraction was in the range of 60% to 65%. Wall motion was normal; there were no regional wall motion abnormalities. - Aortic valve: There was mild regurgitation. - Mitral valve: There was mild regurgitation.   Ct Head Wo Contrast  Result Date: 09/17/2016 CLINICAL DATA:  Initial evaluation for acute syncope.  Fall. EXAM: CT HEAD WITHOUT CONTRAST TECHNIQUE: Contiguous axial images were obtained from the base of the skull through the vertex without intravenous contrast. COMPARISON:  Prior CT from 08/13/2015. FINDINGS: Brain: Generalized cerebral atrophy with moderate chronic microvascular ischemic disease, stable. No acute intracranial hemorrhage. No evidence for acute large vessel territory infarct. No mass lesion, midline shift or mass effect. No hydrocephalus. No extra-axial fluid collection. Vascular: No hyperdense vessel. Vascular calcifications noted within the carotid siphons. Skull: Scalp soft tissues within normal limits the. Calvarium intact. Sinuses/Orbits: Globes and orbits within normal limits. Paranasal sinuses and mastoids are clear. IMPRESSION: 1. No acute intracranial process identified. 2. Moderate  atrophy with chronic small vessel ischemic disease. Electronically Signed   By: Rise Mu M.D.   On: 09/17/2016 06:51   Dg Chest Port 1 View  Result Date: 09/17/2016 CLINICAL DATA:  76 year old male with syncope, bradycardia, and fall. EXAM: PORTABLE CHEST 1 VIEW COMPARISON:  Chest radiograph dated 08/13/2015 FINDINGS: Single portable view of the chest demonstrates clear lungs. There is no pleural effusion or pneumothorax. The cardiac silhouette is within normal limits. No acute osseous pathology identified. IMPRESSION: No active disease. Electronically Signed   By: Elgie Collard M.D.   On: 09/17/2016 06:25      Subjective: - no chest pain, shortness of breath, no abdominal pain, nausea or vomiting.   Discharge Exam: Vitals:   09/18/16 1600 09/18/16 1638  BP: 139/78 139/78  Pulse: 67 (!) 59  Resp: 14 14  Temp:  97.9 F (36.6 C)   Vitals:   09/18/16 1229 09/18/16 1230 09/18/16 1600 09/18/16 1638  BP: (!) 141/63 138/73 139/78 139/78  Pulse: (!) 57 60 67 (!) 59  Resp: 16 12 14 14   Temp:    97.9 F (36.6 C)  TempSrc:    Oral  SpO2: 99% 97% 99% 98%  Weight:      Height:        General: Pt is alert, awake, not in acute distress Cardiovascular: RRR, S1/S2 +, no rubs, no gallops Respiratory: CTA bilaterally, no wheezing, no rhonchi Abdominal: Soft,  NT, ND, bowel sounds + Extremities: no edema, no cyanosis    The results of significant diagnostics from this hospitalization (including imaging, microbiology, ancillary and laboratory) are listed below for reference.     Microbiology: Recent Results (from the past 240 hour(s))  Blood culture (routine x 2)     Status: None (Preliminary result)   Collection Time: 09/17/16  5:45 AM  Result Value Ref Range Status   Specimen Description BLOOD LEFT HAND  Final   Special Requests IN PEDIATRIC BOTTLE  Final   Culture NO GROWTH 1 DAY  Final   Report Status PENDING  Incomplete  Blood culture (routine x 2)     Status:  None (Preliminary result)   Collection Time: 09/17/16  6:00 AM  Result Value Ref Range Status   Specimen Description BLOOD LEFT ANTECUBITAL  Final   Special Requests BOTTLES DRAWN AEROBIC AND ANAEROBIC  5CC  Final   Culture NO GROWTH 1 DAY  Final   Report Status PENDING  Incomplete  MRSA PCR Screening     Status: None   Collection Time: 09/17/16  6:21 PM  Result Value Ref Range Status   MRSA by PCR NEGATIVE NEGATIVE Final    Comment:        The GeneXpert MRSA Assay (FDA approved for NASAL specimens only), is one component of a comprehensive MRSA colonization surveillance program. It is not intended to diagnose MRSA infection nor to guide or monitor treatment for MRSA infections.      Labs: BNP (last 3 results) No results for input(s): BNP in the last 8760 hours. Basic Metabolic Panel:  Recent Labs Lab 09/17/16 0523 09/18/16 0205  NA 140 143  K 3.7 3.8  CL 111 112*  CO2 22 25  GLUCOSE 110* 85  BUN 12 7  CREATININE 0.79 0.71  CALCIUM 8.7* 8.5*  MG 2.1  --    Liver Function Tests: No results for input(s): AST, ALT, ALKPHOS, BILITOT, PROT, ALBUMIN in the last 168 hours. No results for input(s): LIPASE, AMYLASE in the last 168 hours. No results for input(s): AMMONIA in the last 168 hours. CBC:  Recent Labs Lab 09/17/16 0523 09/17/16 1301 09/18/16 0205  WBC 4.4  --  5.6  NEUTROABS 2.8  --   --   HGB 12.4*  --  11.7*  HCT 39.0 37.2* 36.4*  MCV 90.1  --  89.7  PLT 161  --  166   Cardiac Enzymes:  Recent Labs Lab 09/17/16 0945 09/17/16 1310  TROPONINI <0.03 <0.03   BNP: Invalid input(s): POCBNP CBG:  Recent Labs Lab 09/18/16 0630  GLUCAP 79   D-Dimer No results for input(s): DDIMER in the last 72 hours. Hgb A1c No results for input(s): HGBA1C in the last 72 hours. Lipid Profile No results for input(s): CHOL, HDL, LDLCALC, TRIG, CHOLHDL, LDLDIRECT in the last 72 hours. Thyroid function studies  Recent Labs  09/17/16 0643  TSH 2.283    Anemia work up  Recent Labs  09/17/16 0945  VITAMINB12 200   Urinalysis    Component Value Date/Time   COLORURINE STRAW (A) 09/18/2016 1050   APPEARANCEUR CLEAR 09/18/2016 1050   LABSPEC 1.008 09/18/2016 1050   PHURINE 6.0 09/18/2016 1050   GLUCOSEU NEGATIVE 09/18/2016 1050   HGBUR NEGATIVE 09/18/2016 1050   BILIRUBINUR NEGATIVE 09/18/2016 1050   KETONESUR NEGATIVE 09/18/2016 1050   PROTEINUR NEGATIVE 09/18/2016 1050   NITRITE NEGATIVE 09/18/2016 1050   LEUKOCYTESUR TRACE (A) 09/18/2016 1050   Sepsis Labs Invalid  input(s): PROCALCITONIN,  WBC,  LACTICIDVEN Microbiology Recent Results (from the past 240 hour(s))  Blood culture (routine x 2)     Status: None (Preliminary result)   Collection Time: 09/17/16  5:45 AM  Result Value Ref Range Status   Specimen Description BLOOD LEFT HAND  Final   Special Requests IN PEDIATRIC BOTTLE 2ML  Final   Culture NO GROWTH 1 DAY  Final   Report Status PENDING  Incomplete  Blood culture (routine x 2)     Status: None (Preliminary result)   Collection Time: 09/17/16  6:00 AM  Result Value Ref Range Status   Specimen Description BLOOD LEFT ANTECUBITAL  Final   Special Requests BOTTLES DRAWN AEROBIC AND ANAEROBIC  5CC  Final   Culture NO GROWTH 1 DAY  Final   Report Status PENDING  Incomplete  MRSA PCR Screening     Status: None   Collection Time: 09/17/16  6:21 PM  Result Value Ref Range Status   MRSA by PCR NEGATIVE NEGATIVE Final    Comment:        The GeneXpert MRSA Assay (FDA approved for NASAL specimens only), is one component of a comprehensive MRSA colonization surveillance program. It is not intended to diagnose MRSA infection nor to guide or monitor treatment for MRSA infections.      Time coordinating discharge: Over 30 minutes  SIGNED:  Pamella PertGHERGHE, Debbra Digiulio, MD  [facility] 09/18/2016, 6:08 PM Pager [phone]  If 7PM-7AM, please contact night-coverage [email or web address] Password [email or web address]

## 2016-09-19 ENCOUNTER — Encounter (HOSPITAL_COMMUNITY): Payer: Self-pay | Admitting: Internal Medicine

## 2016-09-22 LAB — CULTURE, BLOOD (ROUTINE X 2)
CULTURE: NO GROWTH
Culture: NO GROWTH

## 2016-09-23 ENCOUNTER — Telehealth: Payer: Self-pay | Admitting: *Deleted

## 2016-09-23 NOTE — Telephone Encounter (Signed)
Spoke with patient's wife Youth worker(HCPOA).  She reports that patient's primary cardiologist is [facility]. Bevere[facility].  Patient saw neurology yesterday and Nuedexta and trazodone were added to patient's medications.  Patient has not taken mirtazapine since his syncopal episode earlier this month.  Patient's wife has much anxiety about the possibility of future syncopal episodes.  Reviewed plan to call 911 and advised that ILR can be checked in the ED for episodes.  She verbalizes understanding.  Advised patient's wife that he had AF episode on 1/28, duration 1hr 22min.  Patient was asymptomatic per wife.  Patient has an upcoming appointment with Dr. Beverely Paceheek on 10/06/16.  She wonders if this appointment should be moved sooner.  Reviewed with Dr. Ladona Ridgelaylor, who recommended that I get in touch with Dr. Ledell Nossheek's office.  Patient's wife is agreeable to me contacting his office tomorrow.  She verbalizes agreement with plan to transfer LINQ monitoring to Dr. Ledell Nossheek's office if they request it.  Patient's wife is appreciative of assistance and denies additional questions or concerns at this time.

## 2016-09-25 NOTE — Telephone Encounter (Signed)
LM for [facility].  Gave Device Clinic phone number for return call.

## 2016-09-25 NOTE — Telephone Encounter (Signed)
Spoke with Misty StanleyLisa in the Device Clinic at [facility].  Successfully transferred Care[facility] monitoring to their clinic, noted in [employer].  Misty StanleyLisa to make Dr. Ledell Nossheek's nurse aware of AF episode on 1/28.  If Dr. Beverely Paceheek wants patient to follow-up sooner than 2/12, nurse will contact patient's wife.   Updated patient's wife.  She is aware she will receive a call from Dr. Ledell Nossheek's office if the patient should be seen sooner than 10/06/16.  She agrees to keep 10/01/16 wound check appointment here, unless patient is seen there sooner.  She is appreciative of call and denies additional questions or concerns at this time.

## 2016-09-30 ENCOUNTER — Other Ambulatory Visit: Payer: Self-pay | Admitting: Internal Medicine

## 2016-10-01 ENCOUNTER — Ambulatory Visit: Payer: Medicare Other

## 2016-11-24 ENCOUNTER — Emergency Department (HOSPITAL_BASED_OUTPATIENT_CLINIC_OR_DEPARTMENT_OTHER)
Admission: EM | Admit: 2016-11-24 | Discharge: 2016-11-24 | Disposition: A | Discharge status: Home / Self Care | Payer: Medicare Other | Attending: Emergency Medicine | Admitting: Emergency Medicine

## 2016-11-24 ENCOUNTER — Encounter (HOSPITAL_BASED_OUTPATIENT_CLINIC_OR_DEPARTMENT_OTHER): Payer: Self-pay

## 2016-11-24 DIAGNOSIS — R1013 Epigastric pain: Secondary | ICD-10-CM

## 2016-11-24 DIAGNOSIS — Z79899 Other long term (current) drug therapy: Secondary | ICD-10-CM | POA: Diagnosis not present

## 2016-11-24 DIAGNOSIS — G309 Alzheimer's disease, unspecified: Secondary | ICD-10-CM | POA: Diagnosis not present

## 2016-11-24 DIAGNOSIS — Z7982 Long term (current) use of aspirin: Secondary | ICD-10-CM | POA: Diagnosis not present

## 2016-11-24 LAB — LIPASE, BLOOD: Lipase: 34 U/L (ref 11–51)

## 2016-11-24 LAB — COMPREHENSIVE METABOLIC PANEL
ALBUMIN: 4.2 g/dL (ref 3.5–5.0)
ALK PHOS: 47 U/L (ref 38–126)
ALT: 29 U/L (ref 17–63)
ANION GAP: 5 (ref 5–15)
AST: 31 U/L (ref 15–41)
BUN: 16 mg/dL (ref 6–20)
CALCIUM: 9.2 mg/dL (ref 8.9–10.3)
CHLORIDE: 107 mmol/L (ref 101–111)
CO2: 29 mmol/L (ref 22–32)
Creatinine, Ser: 0.76 mg/dL (ref 0.61–1.24)
GFR calc Af Amer: >60 mL/min (ref >60)
GFR calc non Af Amer: >60 mL/min (ref >60)
GLUCOSE: 113 mg/dL — AB (ref 65–99)
Potassium: 3.3 mmol/L — ABNORMAL LOW (ref 3.5–5.1)
SODIUM: 141 mmol/L (ref 135–145)
Total Bilirubin: 0.8 mg/dL (ref 0.3–1.2)
Total Protein: 6.6 g/dL (ref 6.5–8.1)

## 2016-11-24 LAB — CBC WITH DIFFERENTIAL/PLATELET
BASOS PCT: 1 %
Basophils Absolute: 0 10*3/uL (ref 0.0–0.1)
Eosinophils Absolute: 0.1 10*3/uL (ref 0.0–0.7)
Eosinophils Relative: 1 %
HEMATOCRIT: 40.6 % (ref 39.0–52.0)
HEMOGLOBIN: 13.3 g/dL (ref 13.0–17.0)
LYMPHS ABS: 0.6 10*3/uL — AB (ref 0.7–4.0)
LYMPHS PCT: 10 %
MCH: 30 pg (ref 26.0–34.0)
MCHC: 32.8 g/dL (ref 30.0–36.0)
MCV: 91.4 fL (ref 78.0–100.0)
MONO ABS: 0.4 10*3/uL (ref 0.1–1.0)
MONOS PCT: 7 %
NEUTROS ABS: 5.1 10*3/uL (ref 1.7–7.7)
NEUTROS PCT: 81 %
PLATELETS: 195 10*3/uL (ref 150–400)
RBC: 4.44 MIL/uL (ref 4.22–5.81)
RDW: 14 % (ref 11.5–15.5)
WBC: 6.3 10*3/uL (ref 4.0–10.5)

## 2016-11-24 MED ORDER — RANITIDINE HCL 150 MG PO TABS: 150.0000 mg | ORAL_TABLET | Freq: Two times a day (BID) | ORAL | 0 refills | Status: AC

## 2016-11-24 MED ORDER — OMEPRAZOLE 20 MG PO CPDR: 20.0000 mg | DELAYED_RELEASE_CAPSULE | Freq: Every day | ORAL | 0 refills | Status: AC

## 2016-11-24 NOTE — ED Provider Notes (Signed)
[facility] Provider Note   CSN: [id number] Arrival date & time: 11/24/16  1910   By signing my name below, I, Talbert Nan, attest that this documentation has been prepared under the direction and in the presence of [place] Russo, PA-C. Electronically Signed: Talbert Nan, Scribe. 11/24/16. 12:48 AM.    History   Chief Complaint Chief Complaint  Patient presents with  . Abdominal Pain   LEVEL 5 CAVEAT: HPI and ROS limited due to Alzheimer disease.  HPI Chad Kelly is a 76 y.o. male with h/o Alzheimer disease w emotional disturbance, A fib who presents to the Emergency Department complaining of abdominal pain that began at lunch time today. Pt is a resident at [facility] which began 5 days ago. Per wife, pt said he hurt from head to toe and had associated nausea. Wife denies any emesis. No reports of constipation.  Wife states that he would sit and then start rubbing his stomach. Pt was not able to take part in any of the history due to mental status.    The history is provided by the spouse. The history is limited by the condition of the patient. No language interpreter was used.    Past Medical History:  Diagnosis Date  . A-fib (HCC)   . Alzheimer disease   . Bradycardia   . Hypothermia   . Syncope     Patient Active Problem List   Diagnosis Date Noted  . Sinus bradycardia 09/17/2016  . Acute encephalopathy 09/17/2016  . Hypothermia 09/17/2016  . Syncope and collapse 09/17/2016  . A-fib (HCC)   . Alzheimer disease   . Paroxysmal atrial fibrillation [facility])     Past Surgical History:  Procedure Laterality Date  . EP IMPLANTABLE DEVICE N/A 09/18/2016   Procedure: Loop Recorder Insertion;  Surgeon: Marinus Maw, MD;  Location: [facility] INVASIVE CV LAB;  Service: Cardiovascular;  Laterality: N/A;  . HERNIA REPAIR         Home Medications    Prior to Admission medications   Medication Sig Start Date End Date Taking?  Authorizing Provider  FLUoxetine (PROZAC) 40 MG capsule Take 40 mg by mouth daily.   Yes Historical Provider, MD  perphenazine (TRILAFON) 2 MG tablet Take 2 mg by mouth 2 (two) times daily.    Yes Historical Provider, MD  aspirin EC 81 MG tablet Take 81 mg by mouth daily.    Historical Provider, MD  Dextromethorphan-Quinidine (NUEDEXTA) 20-10 MG CAPS Take 1 capsule by mouth at bedtime. Take one capsule each night x1 week.  After one week, take 1 capsule in the AM and 1 capsule in the PM.    Historical Provider, MD  donepezil (ARICEPT) 10 MG tablet Take 20 mg by mouth at bedtime.    Historical Provider, MD  memantine (NAMENDA) 10 MG tablet Take 10 mg by mouth 2 (two) times daily.    Historical Provider, MD  mirtazapine (REMERON) 15 MG tablet Take 15 mg by mouth at bedtime.    Historical Provider, MD  omeprazole (PRILOSEC) 20 MG capsule Take 1 capsule (20 mg total) by mouth daily. 11/24/16   [place] N Russo, PA-C  pravastatin (PRAVACHOL) 80 MG tablet Take 80 mg by mouth daily.    Historical Provider, MD  ranitidine (ZANTAC) 150 MG tablet Take 1 tablet (150 mg total) by mouth 2 (two) times daily. 11/24/16 12/01/16  [place] N Russo, PA-C  traZODone (DESYREL) 100 MG tablet Take 100 mg by mouth at  bedtime.    Historical Provider, MD    Family History Family History  Problem Relation Age of Onset  . Stomach cancer Mother   . CAD Father   . CAD Sister   . CAD Brother     1 died at age 15, 2nd brother died age 41    Social History Social History  Substance Use Topics  . Smoking status: Never Smoker  . Smokeless tobacco: Never Used  . Alcohol use No     Allergies   Patient has no known allergies.   Review of Systems Review of Systems  Unable to perform ROS: Dementia  Constitutional: Negative for appetite change and fever.  Gastrointestinal: Positive for abdominal pain and nausea. Negative for constipation, diarrhea and vomiting.  Genitourinary: Negative for dysuria.  Musculoskeletal:  Negative for back pain.  All other systems reviewed and are negative.    Physical Exam Updated Vital Signs BP (!) 142/70 (BP Location: Right Arm)   Pulse 60   Temp 97.8 F (36.6 C) (Oral)   Resp 20   Ht  (1.778 m)   Wt 68 kg   SpO2 97%   BMI 21.52 kg/m   Physical Exam  Constitutional: He is oriented to person, place, and time. He appears well-developed and well-nourished.  HENT:  Head: Normocephalic and atraumatic.  Eyes: Conjunctivae are normal.  Neck: Normal range of motion.  Cardiovascular: Normal rate, regular rhythm, normal heart sounds and intact distal pulses.   No murmur heard. Pulmonary/Chest: Effort normal and breath sounds normal. No respiratory distress. He has no wheezes. He has no rales.  Abdominal: Soft. Bowel sounds are normal. He exhibits no distension and no mass. There is no tenderness. There is no rebound and no guarding.  Musculoskeletal: Normal range of motion.  Neurological: He is alert and oriented to person, place, and time.  Skin: Skin is warm and dry.  Psychiatric: He has a normal mood and affect. His behavior is normal.  Nursing note and vitals reviewed.    ED Treatments / Results   DIAGNOSTIC STUDIES: Oxygen Saturation is 97% on room air, normal by my interpretation.    COORDINATION OF CARE: 9:29 PM Discussed treatment plan with pt at bedside and pt agreed to plan, which includes UA, blood work.    Labs (all labs ordered are listed, but only abnormal results are displayed) Labs Reviewed  COMPREHENSIVE METABOLIC PANEL - Abnormal; Notable for the following:       Result Value   Potassium 3.3 (*)    Glucose, Bld 113 (*)    All other components within normal limits  CBC WITH DIFFERENTIAL/PLATELET - Abnormal; Notable for the following:    Lymphs Abs 0.6 (*)    All other components within normal limits  LIPASE, BLOOD    EKG  EKG Interpretation None       Radiology No results found.  Procedures Procedures (including  critical care time)  Medications Ordered in ED Medications - No data to display   Initial Impression / Assessment and Plan / ED Course  I have reviewed the triage vital signs and the nursing notes.  Pertinent labs & imaging results that were available during my care of the patient were reviewed by me and considered in my medical decision making (see chart for details).     Patient is nontoxic, nonseptic appearing, in no apparent distress. Pt not in pain during ED visit. Hx very limited 2/t Alzheimer dz. Labs, imaging and vitals reviewed.  Patient does  not meet the SIRS or Sepsis criteria.  On repeat exam patient does not have a surgical abdomen and there are no peritoneal signs.  No indication of appendicitis, bowel obstruction, bowel perforation, cholecystitis, diverticulitis.  Patient discharged home with empiric treatment for dyspepsia; Omeprazole and Zantac. Encouraged PCP follow up.   Patient discussed with and seen by Dr. Clydene Pugh. Discussed results, findings, treatment and follow up. Patient (and his wife) advised of return precautions. Patient (and his wife) verbalized understanding and agreed with plan.   Final Clinical Impressions(s) / ED Diagnoses   Final diagnoses:  Dyspepsia    New Prescriptions Discharge Medication List as of 11/24/2016 10:45 PM    START taking these medications   Details  omeprazole (PRILOSEC) 20 MG capsule Take 1 capsule (20 mg total) by mouth daily., Starting Mon 11/24/2016, Print    ranitidine (ZANTAC) 150 MG tablet Take 1 tablet (150 mg total) by mouth 2 (two) times daily., Starting Mon 11/24/2016, Until Mon 12/01/2016, Print       I personally performed the services described in this documentation, which was scribed in my presence. The recorded information has been reviewed and is accurate.     [place] N Russo, PA-C 11/25/16 1610    Lyndal Pulley, MD 11/25/16 [phone]

## 2016-11-24 NOTE — ED Triage Notes (Signed)
c/o abd pain, nausea x 1 week-info per wife-pt NAD-slow shuffle gait

## 2016-11-24 NOTE — Discharge Instructions (Signed)
Please read instructions below. Talk with your primary care provider about any new medications.  ° °

## 2018-02-20 IMAGING — CR DG CHEST 1V PORT
1 series · 1 of 1 positions shown · non-contrast
Comparison: Chest radiograph dated 08/13/2015

CLINICAL DATA: 76-year-old male with syncope, bradycardia, and
fall.

EXAM:
PORTABLE CHEST 1 VIEW

[portable]
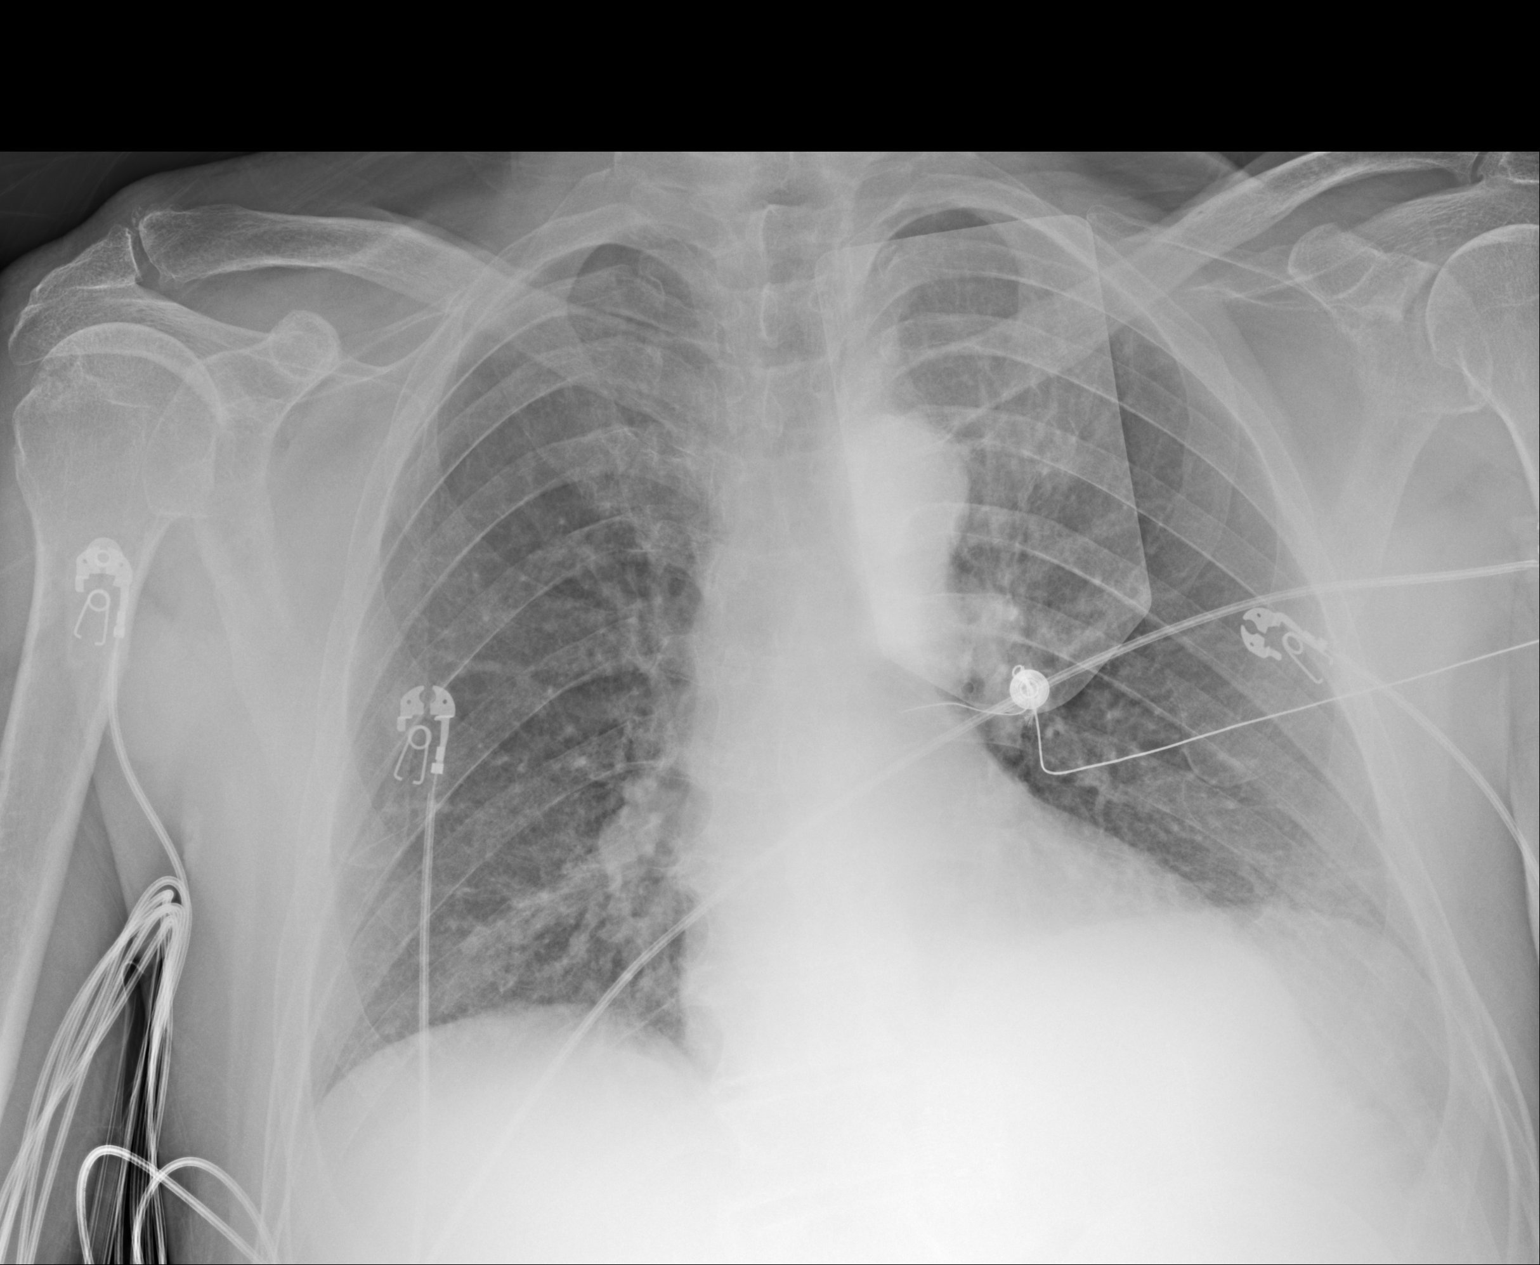

[1 of 1 positions shown; findings below may reference images not displayed]

FINDINGS: Single portable view of the chest demonstrates clear lungs. There is
no pleural effusion or pneumothorax. The cardiac silhouette is
within normal limits. No acute osseous pathology identified.
IMPRESSION: No active disease.

## 2020-04-25 DEATH — deceased
